# Patient Record
Sex: Male | Born: 1970 | Race: White | Hispanic: Yes | Marital: Single | Smoking: Never smoker
Health system: Southern US, Community
[De-identification: ages and names within clinical notes are randomized; demographics above are authoritative.]

---

## 2006-12-31 ENCOUNTER — Emergency Department (HOSPITAL_COMMUNITY): Admission: EM | Admit: 2006-12-31 | Discharge: 2006-12-31 | Payer: Self-pay | Admitting: Emergency Medicine

## 2008-12-04 ENCOUNTER — Emergency Department (HOSPITAL_COMMUNITY): Admission: EM | Admit: 2008-12-04 | Discharge: 2008-12-04 | Payer: Self-pay | Admitting: Emergency Medicine

## 2009-08-29 ENCOUNTER — Ambulatory Visit (HOSPITAL_COMMUNITY): Admission: RE | Admit: 2009-08-29 | Discharge: 2009-08-29 | Payer: Self-pay | Admitting: General Surgery

## 2010-12-06 ENCOUNTER — Emergency Department (HOSPITAL_COMMUNITY): Admission: EM | Admit: 2010-12-06 | Discharge: 2010-04-30 | Payer: Self-pay | Admitting: Emergency Medicine

## 2011-01-21 ENCOUNTER — Encounter: Payer: Self-pay | Admitting: General Surgery

## 2011-01-30 IMAGING — CR DG LUMBAR SPINE COMPLETE 4+V
5 series · 5 of 5 positions shown · non-contrast
Comparison: CT abdomen pelvis 12/04/2008.

CLINICAL DATA: 38-year-old male with acute onset of left low back
pain while at work.

LUMBAR SPINE - COMPLETE 4+ VIEW

[view not recorded (1 of 5)]
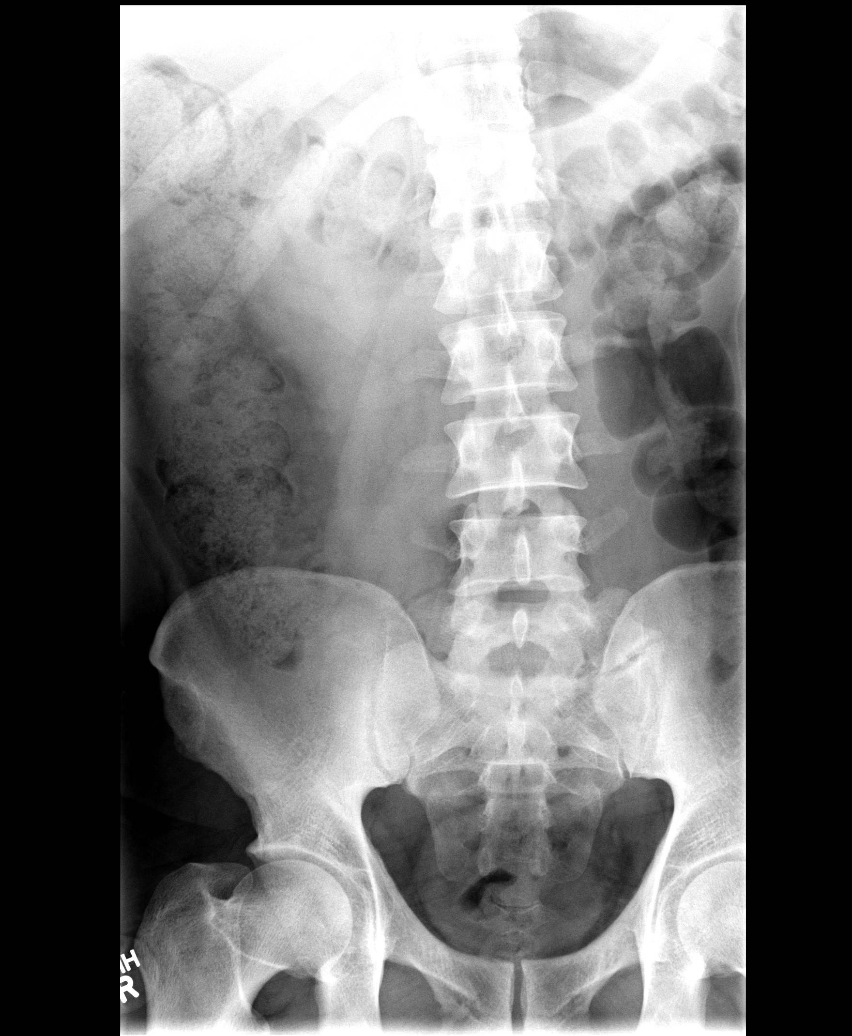

[view not recorded (2 of 5)]
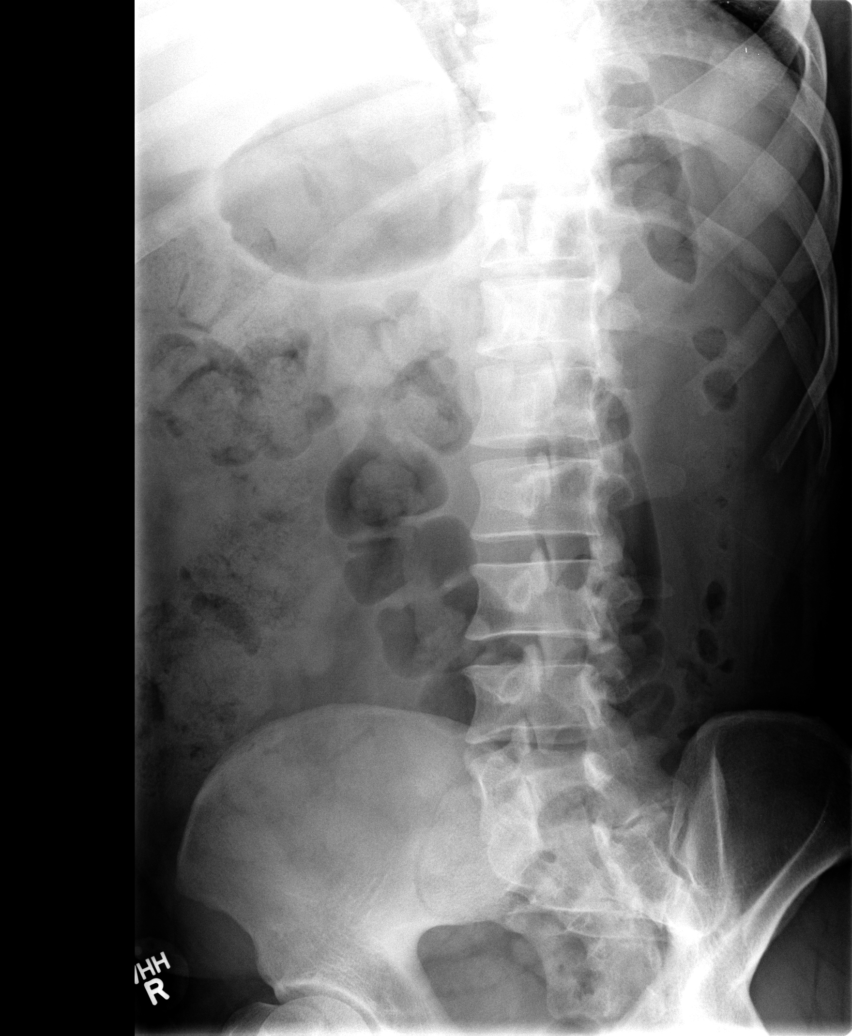

[view not recorded (3 of 5)]
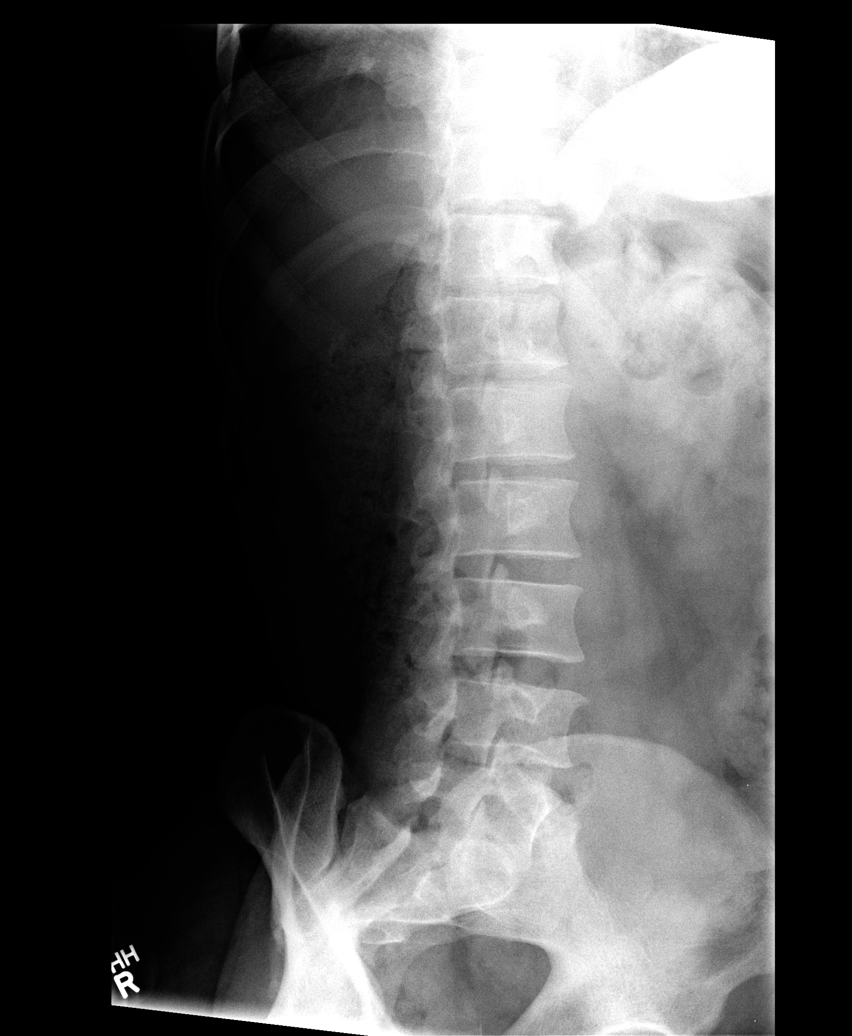

[view not recorded (4 of 5)]
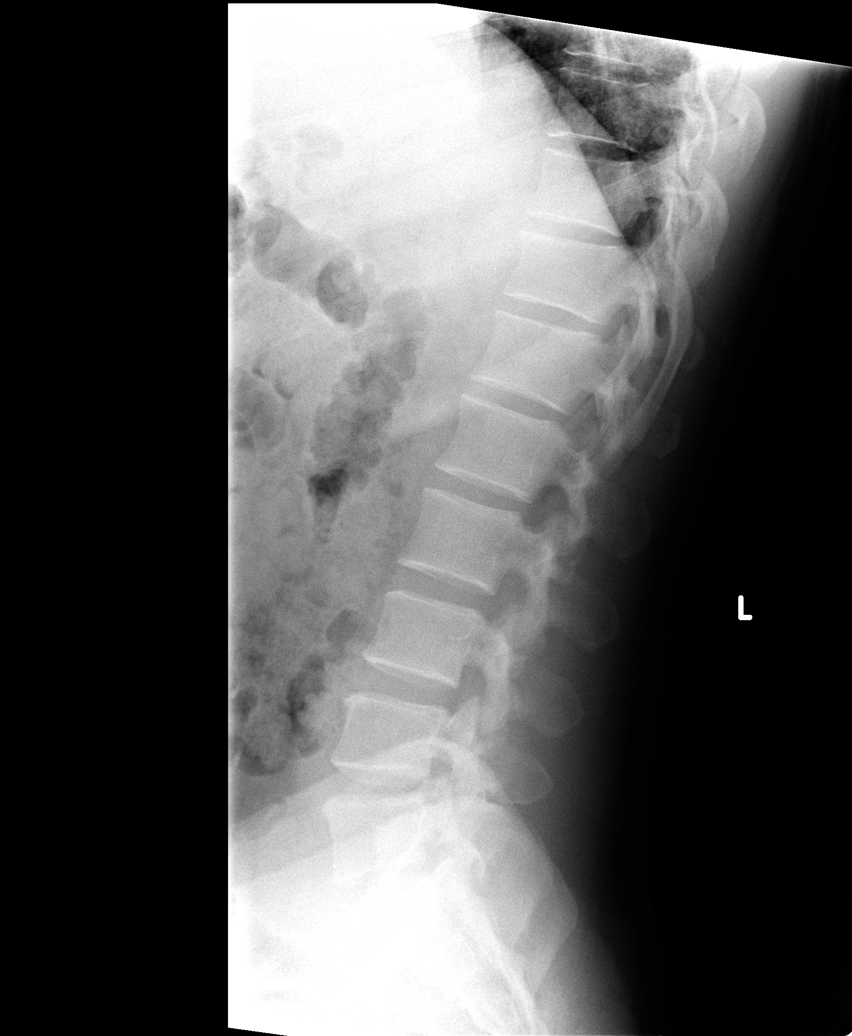

[view not recorded (5 of 5)]
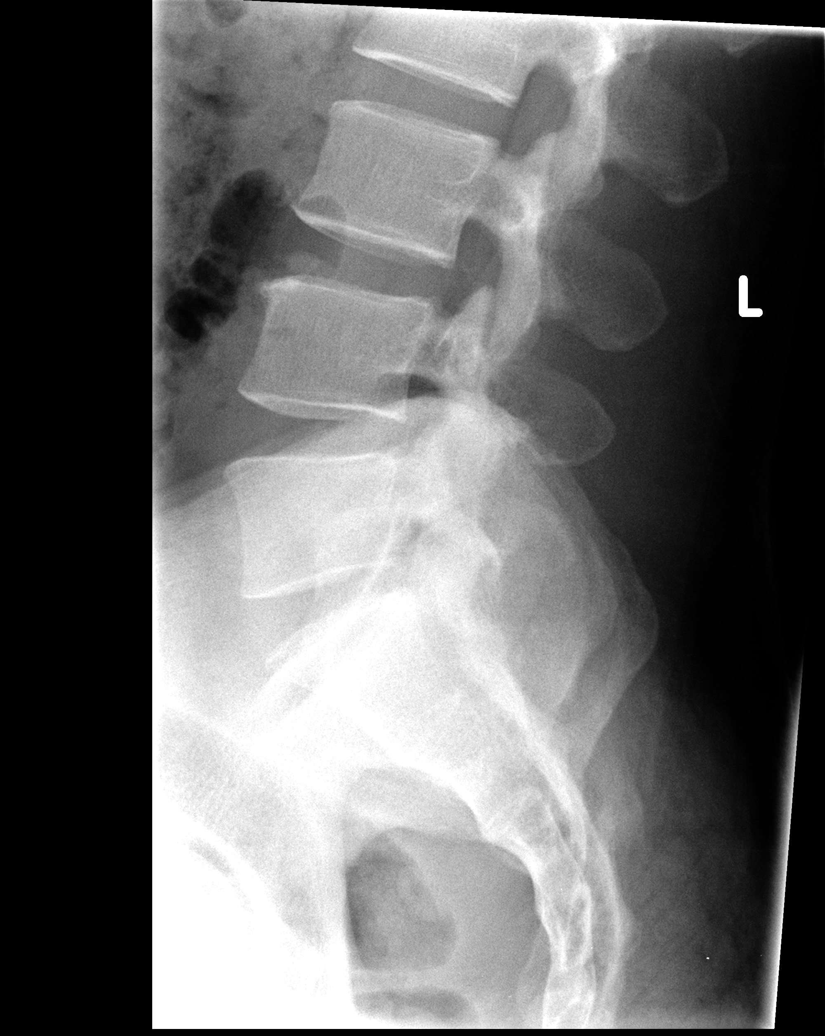

[5 of 5 positions shown; findings below may reference images not displayed]

FINDINGS: Transitional anatomy with hypoplastic twelfth ribs and
partial sacralization of the L5 vertebra designated for the
purposes of this report. Bone mineralization is within normal
limits. Mild disc space narrowing at L5-S1 and to a lesser extent
L4-L5.  Otherwise relatively preserved disc spaces.  No pars
fracture.  Assimilation joint on the left with marginal sclerosis
at L5-L1.  SI joints and sacrum otherwise appear within normal
limits.
IMPRESSION: 1. No acute osseous abnormality identified about the lumbar spine.
2.  Transitional anatomy with degenerated left L5-S1 assimilation
joint.Correlation with radiographs is recommended prior to any
operative intervention.

## 2011-10-04 LAB — DIFFERENTIAL
Basophils Absolute: 0 10*3/uL (ref 0.0–0.1)
Basophils Relative: 0 % (ref 0–1)
Eosinophils Relative: 5 % (ref 0–5)
Lymphocytes Relative: 28 % (ref 12–46)
Monocytes Absolute: 0.5 10*3/uL (ref 0.1–1.0)
Monocytes Relative: 7 % (ref 3–12)
Neutrophils Relative %: 60 % (ref 43–77)

## 2011-10-04 LAB — COMPREHENSIVE METABOLIC PANEL
AST: 94 U/L — ABNORMAL HIGH (ref 0–37)
Albumin: 3.9 g/dL (ref 3.5–5.2)
BUN: 14 mg/dL (ref 6–23)
CO2: 29 mEq/L (ref 19–32)
Chloride: 99 mEq/L (ref 96–112)
Creatinine, Ser: 0.8 mg/dL (ref 0.4–1.5)
GFR calc Af Amer: >60 mL/min (ref >60)
Glucose, Bld: 101 mg/dL — ABNORMAL HIGH (ref 70–99)
Potassium: 3.3 mEq/L — ABNORMAL LOW (ref 3.5–5.1)
Total Bilirubin: 3.1 mg/dL — ABNORMAL HIGH (ref 0.3–1.2)
Total Protein: 6.9 g/dL (ref 6.0–8.3)

## 2011-10-04 LAB — CBC
Hemoglobin: 15.8 g/dL (ref 13.0–17.0)
MCHC: 34.3 g/dL (ref 30.0–36.0)
MCV: 85.6 fL (ref 78.0–100.0)

## 2011-10-04 LAB — URINALYSIS, ROUTINE W REFLEX MICROSCOPIC
Bilirubin Urine: NEGATIVE
Hgb urine dipstick: NEGATIVE
Ketones, ur: NEGATIVE mg/dL
Nitrite: NEGATIVE

## 2015-05-31 ENCOUNTER — Encounter (HOSPITAL_COMMUNITY): Payer: Self-pay | Admitting: Emergency Medicine

## 2015-05-31 ENCOUNTER — Emergency Department (HOSPITAL_COMMUNITY)
Admission: EM | Admit: 2015-05-31 | Discharge: 2015-05-31 | Disposition: A | Discharge status: Home / Self Care | Payer: Worker's Compensation | Attending: Emergency Medicine | Admitting: Emergency Medicine

## 2015-05-31 DIAGNOSIS — S31129A Laceration of abdominal wall with foreign body, unspecified quadrant without penetration into peritoneal cavity, initial encounter: Secondary | ICD-10-CM | POA: Insufficient documentation

## 2015-05-31 DIAGNOSIS — Y9289 Other specified places as the place of occurrence of the external cause: Secondary | ICD-10-CM | POA: Diagnosis not present

## 2015-05-31 DIAGNOSIS — Y998 Other external cause status: Secondary | ICD-10-CM | POA: Insufficient documentation

## 2015-05-31 DIAGNOSIS — Y9389 Activity, other specified: Secondary | ICD-10-CM | POA: Insufficient documentation

## 2015-05-31 DIAGNOSIS — Y288XXA Contact with other sharp object, undetermined intent, initial encounter: Secondary | ICD-10-CM | POA: Diagnosis not present

## 2015-05-31 DIAGNOSIS — S31119A Laceration without foreign body of abdominal wall, unspecified quadrant without penetration into peritoneal cavity, initial encounter: Secondary | ICD-10-CM

## 2015-05-31 NOTE — ED Provider Notes (Signed)
CSN: [id number]     Arrival date & time 05/31/15  1454 History   First MD Initiated Contact with Patient 05/31/15 1600     Chief Complaint  Patient presents with  . Laceration     (Consider location/radiation/quality/duration/timing/severity/associated sxs/prior Treatment) HPI   Bradley Cook is a 44 y.o. male who presents to the Emergency Department complaining of laceration to his upper abdomen that occurred this afternoon.  Patient states he was using a box cutter at the time the injury occurred.  This is a work related injury.  He states pain is minimal.  Last Td was < 5 years ago. He states bleeding was controlled with pressure.   He denies abd pain, fever, vomiting, nausea or pain with breathing.    History reviewed. No pertinent past medical history. History reviewed. No pertinent past surgical history. No family history on file. History  Substance Use Topics  . Smoking status: Never Smoker   . Smokeless tobacco: Not on file  . Alcohol Use: No    Review of Systems  Constitutional: Negative for fever and chills.  Respiratory: Negative for shortness of breath.   Gastrointestinal: Negative for nausea, vomiting, abdominal pain and abdominal distention.  Musculoskeletal: Negative for back pain, joint swelling and arthralgias.  Skin: Positive for wound.       Laceration   Neurological: Negative for dizziness, weakness and numbness.  Hematological: Does not bruise/bleed easily.  All other systems reviewed and are negative.     Allergies  Review of patient's allergies indicates not on file.  Home Medications   Prior to Admission medications   Not on File   BP 134/84 mmHg  Pulse 63  Temp(Src) 98.9 F (37.2 C) (Oral)  Resp 20  Ht 5' 0.5" (1.537 m)  Wt 160 lb (72.576 kg)  BMI 30.72 kg/m2  SpO2 98% Physical Exam  Constitutional: He is oriented to person, place, and time. He appears well-developed and well-nourished. No distress.  HENT:  Head: Normocephalic and  atraumatic.  Cardiovascular: Normal rate, regular rhythm, normal heart sounds and intact distal pulses.   No murmur heard. Pulmonary/Chest: Effort normal and breath sounds normal. No respiratory distress.  Abdominal: Soft. Bowel sounds are normal. He exhibits no distension. There is no tenderness. There is no rebound and no guarding.  Musculoskeletal: Normal range of motion. He exhibits no edema or tenderness.  Neurological: He is alert and oriented to person, place, and time. He exhibits normal muscle tone. Coordination normal.  Skin: Skin is warm. Laceration noted.     Laceration to the upper abdomen.  No edema, bleeding controlled.   Nursing note and vitals reviewed.   ED Course  Procedures (including critical care time) Labs Review Labs Reviewed - No data to display  Imaging Review No results found.   EKG Interpretation None       LACERATION REPAIR Performed by: Samayah Novinger L. Authorized by: Maxwell Caul Consent: Verbal consent obtained. Risks and benefits: risks, benefits and alternatives were discussed Consent given by: patient Patient identity confirmed: provided demographic data  Prepped and Draped in normal sterile fashion  Wound explored, no penetrating laceration  Laceration Location: upper abdomen  Laceration Length: 1.5 cm  No Foreign Bodies seen or palpated  Anesthesia: none  Irrigation method: syringe Amount of cleaning: standard  Skin closure: dermabond  Technique: topical  Patient tolerance: Patient tolerated the procedure well with no immediate complications.  MDM   Final diagnoses:  Laceration of abdomen, initial encounter   Patient is well  appearing, ambulates with a steady gait.  Laceration is superficial, no clinical concern for penetrating injury.  Bleeding controlled prior to closure.     Pt was advised to return for any increased pain, fever, redness, swelling or abdominal pain.  Pt verbalized understanding and agrees  to plan    Pauline Ausammy Nehemyah Foushee, PA-C 05/31/15 1706  Bethann BerkshireJoseph Zammit, MD 06/01/15 1128

## 2015-05-31 NOTE — ED Notes (Signed)
Pt was using a box cutter at work, has laceration to abdomen

## 2015-05-31 NOTE — ED Notes (Signed)
Pt made aware to return if symptoms worsen or if any life threatening symptoms occur.   

## 2016-03-21 ENCOUNTER — Emergency Department (HOSPITAL_COMMUNITY)
Admission: EM | Admit: 2016-03-21 | Discharge: 2016-03-22 | Disposition: A | Discharge status: Home / Self Care | Payer: PRIVATE HEALTH INSURANCE | Attending: Emergency Medicine | Admitting: Emergency Medicine

## 2016-03-21 ENCOUNTER — Encounter (HOSPITAL_COMMUNITY): Payer: Self-pay

## 2016-03-21 DIAGNOSIS — B029 Zoster without complications: Secondary | ICD-10-CM

## 2016-03-21 DIAGNOSIS — B0239 Other herpes zoster eye disease: Secondary | ICD-10-CM | POA: Diagnosis not present

## 2016-03-21 DIAGNOSIS — R21 Rash and other nonspecific skin eruption: Secondary | ICD-10-CM | POA: Diagnosis present

## 2016-03-21 NOTE — ED Notes (Signed)
Pt has a rash to his left forehead with some inclusion over the left eyelid x 1 week, states is itching and some drainage previously.  Some areas of rash are scabbed over, appear to be herpetic in areas.

## 2016-03-22 DIAGNOSIS — B0239 Other herpes zoster eye disease: Secondary | ICD-10-CM | POA: Diagnosis not present

## 2016-03-22 MED ORDER — OXYCODONE-ACETAMINOPHEN 5-325 MG PO TABS: 2.0000 | ORAL_TABLET | ORAL | Status: AC | PRN

## 2016-03-22 MED ORDER — ACYCLOVIR 800 MG PO TABS
800.0000 mg | ORAL_TABLET | Freq: Once | ORAL | Status: AC
  Administered 2016-03-22: 800 mg via ORAL
  Filled 2016-03-22: qty 1

## 2016-03-22 MED ORDER — MELOXICAM 15 MG PO TABS: 15.0000 mg | ORAL_TABLET | Freq: Every day | ORAL | Status: AC

## 2016-03-22 MED ORDER — PREDNISONE 50 MG PO TABS
60.0000 mg | ORAL_TABLET | Freq: Once | ORAL | Status: AC
  Administered 2016-03-22: 60 mg via ORAL
  Filled 2016-03-22: qty 1

## 2016-03-22 MED ORDER — OXYCODONE-ACETAMINOPHEN 5-325 MG PO TABS: 1.0000 | ORAL_TABLET | Freq: Four times a day (QID) | ORAL | Status: AC | PRN

## 2016-03-22 MED ORDER — ONDANSETRON HCL 4 MG PO TABS
4.0000 mg | ORAL_TABLET | Freq: Once | ORAL | Status: AC
  Administered 2016-03-22: 4 mg via ORAL
  Filled 2016-03-22: qty 1

## 2016-03-22 MED ORDER — DEXAMETHASONE 4 MG PO TABS: 4.0000 mg | ORAL_TABLET | Freq: Two times a day (BID) | ORAL | Status: AC

## 2016-03-22 MED ORDER — KETOROLAC TROMETHAMINE 10 MG PO TABS
10.0000 mg | ORAL_TABLET | Freq: Once | ORAL | Status: AC
  Administered 2016-03-22: 10 mg via ORAL
  Filled 2016-03-22: qty 1

## 2016-03-22 MED ORDER — ACYCLOVIR 800 MG PO TABS: 800.0000 mg | ORAL_TABLET | Freq: Four times a day (QID) | ORAL | Status: AC

## 2016-03-22 NOTE — Discharge Instructions (Signed)
Culebrilla  (Shingles)    La culebrilla, también conocida como herpes zóster, es una infección que causa una erupción dolorosa en la piel y ampollas llenas de líquido. La culebrilla no está relacionada con el herpes genital, que es una enfermedad de transmisión sexual.     Solo se manifiesta en personas que:  · Tuvieron varicela.  · Recibieron la vacuna contra la varicela. (Esto es poco frecuente).  CAUSAS  La causa de la culebrilla es el virus de la varicela zóster (VVZ), el mismo virus que causa la varicela. Después de la exposición al virus de la varicela zóster, este permanece en el organismo en un estado inactivo (latente). La culebrilla aparece si el virus se reactiva. Esto puede ocurrir muchos años después de la exposición inicial al virus. No se conocen las causas por las que este virus se reactiva.  FACTORES DE RIESGO  Las personas que tuvieron varicela o recibieron la vacuna contra la varicela están en riesgo de tener culebrilla. La infección es más frecuente en las personas que:  · Tienen más de 50 años.  · Tienen el sistema de defensa del organismo (sistema inmunitario) debilitado, como en los enfermos con VIH, sida o cáncer.  · Toman medicamentos que debilitan el sistema inmunitario, como los medicamentos para trasplantes.  · Están sometidas a un gran estrés.  SÍNTOMAS  Los primeros síntomas de esta afección pueden incluir picazón, hormigueo o dolor en una zona de la piel. Este dolor se puede describir como ardor, punzante o pulsátil.  Unos días o semanas después de que comienzan los síntomas, aparece una erupción rojiza y dolorosa en un lado del cuerpo en un patrón con forma de cinto o de banda. Finalmente, la erupción se convierte en ampollas llenas de líquido que se abren, forman costras y se secan en dos o tres semanas.  En cualquier momento durante la infección, puede presentar lo siguiente:  · Fiebre.  · Escalofríos.  · Dolor de cabeza.  · Malestar estomacal.  DIAGNÓSTICO  Esta afección se  diagnostica con un examen de la piel. En algunos casos, se extraen muestras de piel o del líquido de las ampollas antes de definir el diagnóstico. Estas muestras se examinan con el microscopio o se envían al laboratorio para su análisis.  TRATAMIENTO  No hay una cura específica para esta afección. El médico probablemente le recete medicamentos para ayudarlo a controlar el dolor, a recuperarse más rápido y a evitar problemas a largo plazo. Entre los medicamentos se incluyen los siguientes:  · Medicamentos antivirales.  · Antiinflamatorios.  · Analgésicos.  Si la zona afectada está en el rostro, podrán derivarlo a un especialista, como un médico especialista en ojos (oftalmólogo) o en oídos, nariz y garganta (otorrinolaringólogo) para evitar problemas oculares, dolor crónico o discapacidad.  INSTRUCCIONES PARA EL CUIDADO EN EL HOGAR  Medicamentos  · Tome los medicamentos solamente como se lo haya indicado el médico.  · Aplique una crema anestésica o una para calmar la picazón en la zona afectada según las indicaciones del médico.  Cuidado de la erupción y las ampollas  · Tome un baño de agua fría o aplique compresas frías en la zona de la erupción o las ampollas como se lo haya indicado el médico. Esto aliviará el dolor y la picazón.  · Mantenga la zona de la erupción cubierta con una venda (vendaje). Use ropa holgada para ayudar a aliviar el dolor del roce con la erupción.  · Mantenga la erupción y las ampollas limpias con jabón suave   y agua fresca o como se lo indique el médico.  · Controle la erupción todos los días para detectar signos de infección. Estos signos incluyen enrojecimiento, hinchazón y dolor que perdura o aumenta.  · No pellizque las ampollas.  · No se rasque la zona de la erupción.  Instrucciones generales  · Haga reposo según las indicaciones del médico.  · Concurra a todas las visitas de control como se lo haya indicado el médico. Esto es importante.  · Hasta tanto las ampollas formen costras, la  infección puede causar varicela en las personas que nunca la tuvieron o no se vacunaron contra la varicela. Para impedir que esto suceda, evite el contacto con otras personas, en especial:    Bebés.    Embarazadas.    Niños que tienen eccema.    Personas mayores que han recibido un trasplante.    Personas con enfermedades crónicas, como leucemia y sida.  SOLICITE ATENCIÓN MÉDICA SI:  · El dolor no se alivia con los medicamentos prescritos.  · El dolor no mejora después de que la erupción desaparece.  · La erupción parece infectada. Los signos de infección incluyen enrojecimiento, hinchazón y dolor que perdura o aumenta.  SOLICITE ATENCIÓN MÉDICA DE INMEDIATO SI:  · La erupción aparece en el rostro o la nariz.  · Tiene dolor en el rostro, en la zona de los ojos o tiene pérdida de la sensibilidad en un lado del rostro.  · Siente dolor o un zumbido en el oído.  · Tiene pérdida del gusto.  · La afección empeora.     Esta información no tiene como fin reemplazar el consejo del médico. Asegúrese de hacerle al médico cualquier pregunta que tenga.     Document Released: 09/25/2005 Document Revised: 01/06/2015  Elsevier Interactive Patient Education ©2016 [employer]

## 2016-03-22 NOTE — ED Provider Notes (Signed)
CSN: [id number]     Arrival date & time 03/21/16  2257 History   First MD Initiated Contact with Patient 03/21/16 2345     Chief Complaint  Patient presents with  . Rash     (Consider location/radiation/quality/duration/timing/severity/associated sxs/prior Treatment) Patient is a 45 y.o. male presenting with rash. The history is provided by the patient.  Rash Location:  Head/neck Head/neck rash location:  Scalp Quality: blistering, itchiness, painful and redness   Pain details:    Severity:  Moderate   Onset quality:  Gradual   Duration:  4 days   Timing:  Intermittent   Progression:  Worsening Severity:  Moderate Onset quality:  Gradual Duration:  4 days Timing:  Intermittent Progression:  Worsening Context: not medications, not new detergent/soap, not plant contact and not sick contacts   Relieved by:  Nothing Associated symptoms: no abdominal pain, no diarrhea, no fever, no nausea, no shortness of breath and not vomiting     History reviewed. No pertinent past medical history. History reviewed. No pertinent past surgical history. No family history on file. Social History  Substance Use Topics  . Smoking status: Never Smoker   . Smokeless tobacco: None  . Alcohol Use: No    Review of Systems  Constitutional: Negative for fever.  Respiratory: Negative for shortness of breath.   Gastrointestinal: Negative for nausea, vomiting, abdominal pain and diarrhea.  Skin: Positive for rash.  All other systems reviewed and are negative.     Allergies  Review of patient's allergies indicates no known allergies.  Home Medications   Prior to Admission medications   Not on File   BP 110/68 mmHg  Pulse 89  Temp(Src) 98.9 F (37.2 C) (Oral)  Resp 16  Ht 5' (1.524 m)  Wt 78.472 kg  BMI 33.79 kg/m2  SpO2 98% Physical Exam  Constitutional: He is oriented to person, place, and time. He appears well-developed and well-nourished.  Non-toxic appearance.  HENT:  Head:  Normocephalic.    Right Ear: Tympanic membrane and external ear normal.  Left Ear: Tympanic membrane and external ear normal.  Conjunctiva not injected. No rash of the conjunctiva or cornea.  Eyes: EOM and lids are normal. Pupils are equal, round, and reactive to light.  Neck: Normal range of motion. Neck supple. Carotid bruit is not present.  Cardiovascular: Normal rate, regular rhythm, normal heart sounds, intact distal pulses and normal pulses.   Pulmonary/Chest: Breath sounds normal. No respiratory distress.  Abdominal: Soft. Bowel sounds are normal. There is no tenderness. There is no guarding.  Musculoskeletal: Normal range of motion.  Lymphadenopathy:       Head (right side): No submandibular adenopathy present.       Head (left side): No submandibular adenopathy present.    He has no cervical adenopathy.  Neurological: He is alert and oriented to person, place, and time. He has normal strength. No cranial nerve deficit or sensory deficit.  Skin: Skin is warm and dry.  Psychiatric: He has a normal mood and affect. His speech is normal.  Nursing note and vitals reviewed.   ED Course  Procedures (including critical care time) Labs Review Labs Reviewed - No data to display  Imaging Review No results found. I have personally reviewed and evaluated these images and lab results as part of my medical decision-making.   EKG Interpretation None      MDM  Discussed with patient that he has herpes zoster involving the left for head to the upper eyelid. The  patient states that he has had a similar problem in the past. The patient will be treated with acyclovir, Decadron, Mobic, and Percocet. The patient states that he follow with his primary physician [place] later during the week. The patient will return to the emergency department if any changes or problems before he is seen by his primary physician.    Final diagnoses:  Herpes zoster    *I have reviewed nursing notes,  vital signs, and all appropriate lab and imaging results for this patient.Ivery Quale, PA-C 03/24/16 2048  Layla Maw Ward, DO 03/27/16 1610

## 2016-03-28 MED FILL — Oxycodone w/ Acetaminophen Tab 5-325 MG: ORAL | Qty: 6 | Status: AC
